# Patient Record
Sex: Female | Born: 1990 | Race: White | Hispanic: No | Marital: Single | Smoking: Current every day smoker
Health system: Southern US, Community
[De-identification: ages and names within clinical notes are randomized; demographics above are authoritative.]

## PROBLEM LIST (undated history)

## (undated) DIAGNOSIS — I82409 Acute embolism and thrombosis of unspecified deep veins of unspecified lower extremity: Secondary | ICD-10-CM

---
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
## 2008-04-30 ENCOUNTER — Emergency Department (HOSPITAL_COMMUNITY): Admission: EM | Admit: 2008-04-30 | Discharge: 2008-04-30 | Payer: Self-pay | Admitting: Emergency Medicine

## 2008-11-03 ENCOUNTER — Emergency Department (HOSPITAL_COMMUNITY): Admission: EM | Admit: 2008-11-03 | Discharge: 2008-11-03 | Payer: Self-pay | Admitting: Emergency Medicine

## 2009-07-17 ENCOUNTER — Emergency Department (HOSPITAL_COMMUNITY): Admission: EM | Admit: 2009-07-17 | Discharge: 2009-07-17 | Payer: Self-pay | Admitting: Emergency Medicine

## 2009-09-13 ENCOUNTER — Emergency Department (HOSPITAL_COMMUNITY): Admission: EM | Admit: 2009-09-13 | Discharge: 2009-09-13 | Payer: Self-pay | Admitting: Emergency Medicine

## 2009-09-26 ENCOUNTER — Inpatient Hospital Stay (HOSPITAL_COMMUNITY): Admission: EM | Admit: 2009-09-26 | Discharge: 2009-09-27 | Payer: Self-pay | Admitting: Emergency Medicine

## 2009-09-27 ENCOUNTER — Inpatient Hospital Stay (HOSPITAL_COMMUNITY): Admission: AD | Admit: 2009-09-27 | Discharge: 2009-10-01 | Payer: Self-pay | Admitting: Psychiatry

## 2009-09-27 ENCOUNTER — Ambulatory Visit: Payer: Self-pay | Admitting: Psychiatry

## 2010-03-20 LAB — BASIC METABOLIC PANEL
BUN: 6 mg/dL (ref 6–23)
BUN: 8 mg/dL (ref 6–23)
BUN: 9 mg/dL (ref 6–23)
CO2: 19 mEq/L (ref 19–32)
Calcium: 8.9 mg/dL (ref 8.4–10.5)
Calcium: 9 mg/dL (ref 8.4–10.5)
Chloride: 109 mEq/L (ref 96–112)
Chloride: 116 mEq/L — ABNORMAL HIGH (ref 96–112)
Chloride: 108 mEq/L (ref 96–112)
Creatinine, Ser: 0.94 mg/dL (ref 0.4–1.2)
Creatinine, Ser: 0.73 mg/dL (ref 0.4–1.2)
GFR calc Af Amer: >60 mL/min (ref >60)
GFR calc Af Amer: >60 mL/min (ref >60)
GFR calc Af Amer: >60 mL/min (ref >60)
GFR calc non Af Amer: >60 mL/min (ref >60)
GFR calc non Af Amer: >60 mL/min (ref >60)
Glucose, Bld: 92 mg/dL (ref 70–99)
Potassium: 2.9 mEq/L — ABNORMAL LOW (ref 3.5–5.1)
Potassium: 3.7 mEq/L (ref 3.5–5.1)
Sodium: 139 mEq/L (ref 135–145)
Sodium: 142 mEq/L (ref 135–145)

## 2010-03-20 LAB — URINALYSIS, ROUTINE W REFLEX MICROSCOPIC
Bilirubin Urine: NEGATIVE
Glucose, UA: NEGATIVE mg/dL
Ketones, ur: NEGATIVE mg/dL
Leukocytes, UA: NEGATIVE
Nitrite: NEGATIVE
Protein, ur: NEGATIVE mg/dL
Specific Gravity, Urine: 1.025 (ref 1.005–1.030)
Urobilinogen, UA: 0.2 mg/dL (ref 0.0–1.0)
pH: 6 (ref 5.0–8.0)

## 2010-03-20 LAB — DIFFERENTIAL
Basophils Absolute: 0.1 10*3/uL (ref 0.0–0.1)
Basophils Absolute: 0 10*3/uL (ref 0.0–0.1)
Basophils Relative: 1 % (ref 0–1)
Eosinophils Absolute: 0.1 10*3/uL (ref 0.0–0.7)
Eosinophils Absolute: 0.2 10*3/uL (ref 0.0–0.7)
Eosinophils Relative: 1 % (ref 0–5)
Eosinophils Relative: 3 % (ref 0–5)
Lymphocytes Relative: 20 % (ref 12–46)
Lymphocytes Relative: 33 % (ref 12–46)
Lymphs Abs: 1.9 10*3/uL (ref 0.7–4.0)
Monocytes Absolute: 0.5 10*3/uL (ref 0.1–1.0)
Monocytes Relative: 5 % (ref 3–12)
Neutro Abs: 7.1 10*3/uL (ref 1.7–7.7)
Neutrophils Relative %: 73 % (ref 43–77)
Neutrophils Relative %: 59 % (ref 43–77)

## 2010-03-20 LAB — ETHANOL
Alcohol, Ethyl (B): 48 mg/dL — ABNORMAL HIGH (ref 0–10)
Alcohol, Ethyl (B): 157 mg/dL — ABNORMAL HIGH (ref 0–10)

## 2010-03-20 LAB — CBC
HCT: 42 % (ref 36.0–46.0)
Hemoglobin: 14.4 g/dL (ref 12.0–15.0)
MCH: 32.7 pg (ref 26.0–34.0)
MCHC: 34.4 g/dL (ref 30.0–36.0)
MCV: 95.1 fL (ref 78.0–100.0)
MCV: 95.3 fL (ref 78.0–100.0)
Platelets: 234 10*3/uL (ref 150–400)
RBC: 4.41 MIL/uL (ref 3.87–5.11)
RBC: 4.45 MIL/uL (ref 3.87–5.11)
RDW: 13.6 % (ref 11.5–15.5)
RDW: 13.5 % (ref 11.5–15.5)
WBC: 9.7 10*3/uL (ref 4.0–10.5)
WBC: 8.6 10*3/uL (ref 4.0–10.5)

## 2010-03-20 LAB — CARDIAC PANEL(CRET KIN+CKTOT+MB+TROPI)
CK, MB: 0.5 ng/mL (ref 0.3–4.0)
CK, MB: 0.8 ng/mL (ref 0.3–4.0)
Relative Index: 0.3 (ref 0.0–2.5)
Relative Index: 0.6 (ref 0.0–2.5)
Total CK: 127 U/L (ref 7–177)
Total CK: 159 U/L (ref 7–177)
Total CK: 175 U/L (ref 7–177)
Troponin I: 0.01 ng/mL (ref 0.00–0.06)
Troponin I: <0.01 ng/mL (ref 0.00–0.06)

## 2010-03-20 LAB — HEPATIC FUNCTION PANEL
ALT: 16 U/L (ref 0–35)
AST: 25 U/L (ref 0–37)
Albumin: 4.5 g/dL (ref 3.5–5.2)
Alkaline Phosphatase: 82 U/L (ref 39–117)
Bilirubin, Direct: 0.1 mg/dL (ref 0.0–0.3)
Indirect Bilirubin: 0.3 mg/dL (ref 0.3–0.9)
Total Bilirubin: 0.4 mg/dL (ref 0.3–1.2)
Total Protein: 7.6 g/dL (ref 6.0–8.3)

## 2010-03-20 LAB — MAGNESIUM: Magnesium: 1.8 mg/dL (ref 1.5–2.5)

## 2010-03-20 LAB — RAPID URINE DRUG SCREEN, HOSP PERFORMED
Amphetamines: NOT DETECTED
Amphetamines: NOT DETECTED
Barbiturates: NOT DETECTED
Opiates: NOT DETECTED
Tetrahydrocannabinol: POSITIVE — AB

## 2010-03-20 LAB — POCT PREGNANCY, URINE: Preg Test, Ur: NEGATIVE

## 2010-03-20 LAB — URINE MICROSCOPIC-ADD ON

## 2010-03-20 LAB — POTASSIUM: Potassium: 4 mEq/L (ref 3.5–5.1)

## 2010-03-20 LAB — SALICYLATE LEVEL: Salicylate Lvl: <4 mg/dL (ref 2.8–20.0)

## 2010-03-20 LAB — MRSA PCR SCREENING: MRSA by PCR: NEGATIVE

## 2010-03-20 LAB — ACETAMINOPHEN LEVEL: Acetaminophen (Tylenol), Serum: <10 ug/mL — ABNORMAL LOW (ref 10–30)

## 2010-03-23 LAB — DIFFERENTIAL
Lymphocytes Relative: 25 % (ref 12–46)
Lymphs Abs: 2.6 10*3/uL (ref 0.7–4.0)
Monocytes Relative: 7 % (ref 3–12)
Neutro Abs: 6.9 10*3/uL (ref 1.7–7.7)
Neutrophils Relative %: 66 % (ref 43–77)

## 2010-03-23 LAB — BASIC METABOLIC PANEL
BUN: 9 mg/dL (ref 6–23)
CO2: 22 mEq/L (ref 19–32)
Chloride: 106 mEq/L (ref 96–112)
Creatinine, Ser: 0.63 mg/dL (ref 0.4–1.2)
GFR calc Af Amer: >60 mL/min (ref >60)
Glucose, Bld: 101 mg/dL — ABNORMAL HIGH (ref 70–99)

## 2010-03-23 LAB — URINALYSIS, ROUTINE W REFLEX MICROSCOPIC
Ketones, ur: NEGATIVE mg/dL
Leukocytes, UA: NEGATIVE
Nitrite: NEGATIVE
Protein, ur: NEGATIVE mg/dL
Urobilinogen, UA: 0.2 mg/dL (ref 0.0–1.0)

## 2010-03-23 LAB — WET PREP, GENITAL: Yeast Wet Prep HPF POC: NONE SEEN

## 2010-03-23 LAB — TYPE AND SCREEN: Antibody Screen: NEGATIVE

## 2010-03-23 LAB — CBC
MCH: 32.3 pg (ref 26.0–34.0)
MCV: 95.8 fL (ref 78.0–100.0)
Platelets: 273 10*3/uL (ref 150–400)
RBC: 4.31 MIL/uL (ref 3.87–5.11)

## 2010-03-23 LAB — POCT PREGNANCY, URINE: Preg Test, Ur: POSITIVE

## 2010-03-23 LAB — GC/CHLAMYDIA PROBE AMP, GENITAL
Chlamydia, DNA Probe: NEGATIVE
GC Probe Amp, Genital: NEGATIVE

## 2010-03-23 LAB — HCG, QUANTITATIVE, PREGNANCY: hCG, Beta Chain, Quant, S: 74596 m[IU]/mL — ABNORMAL HIGH (ref <5)
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
## 2010-10-03 LAB — BASIC METABOLIC PANEL
Calcium: 9.1
Chloride: 117 — ABNORMAL HIGH
GFR calc Af Amer: >60
GFR calc non Af Amer: >60
Glucose, Bld: 112 — ABNORMAL HIGH
Potassium: 3.5
Sodium: 141
Sodium: 141

## 2010-10-03 LAB — URINALYSIS, ROUTINE W REFLEX MICROSCOPIC
Glucose, UA: NEGATIVE
Hgb urine dipstick: NEGATIVE
Specific Gravity, Urine: >1.03 — ABNORMAL HIGH
Urobilinogen, UA: 0.2
pH: 6

## 2010-10-03 LAB — DIFFERENTIAL
Basophils Absolute: 0
Lymphocytes Relative: 25
Monocytes Absolute: 0.6
Neutro Abs: 4.4

## 2010-10-03 LAB — CBC
HCT: 34.4 — ABNORMAL LOW
Hemoglobin: 12.7
Hemoglobin: 11.8 — ABNORMAL LOW
MCV: 94.6
Platelets: 214
RBC: 3.64 — ABNORMAL LOW
RDW: 12.9
WBC: 6.6

## 2010-10-03 LAB — URINE MICROSCOPIC-ADD ON

## 2010-10-22 ENCOUNTER — Emergency Department (HOSPITAL_COMMUNITY)
Admission: EM | Admit: 2010-10-22 | Discharge: 2010-10-22 | Discharge status: Against Medical Advice | Payer: Medicaid Other | Attending: Emergency Medicine | Admitting: Emergency Medicine

## 2010-10-22 DIAGNOSIS — Z86718 Personal history of other venous thrombosis and embolism: Secondary | ICD-10-CM | POA: Insufficient documentation

## 2010-10-22 DIAGNOSIS — Z5320 Procedure and treatment not carried out because of patient's decision for unspecified reasons: Secondary | ICD-10-CM | POA: Insufficient documentation

## 2010-10-22 DIAGNOSIS — O99891 Other specified diseases and conditions complicating pregnancy: Secondary | ICD-10-CM | POA: Insufficient documentation

## 2010-10-22 DIAGNOSIS — M79609 Pain in unspecified limb: Secondary | ICD-10-CM | POA: Insufficient documentation

## 2010-10-22 HISTORY — DX: Acute embolism and thrombosis of unspecified deep veins of unspecified lower extremity: I82.409

## 2010-10-22 NOTE — ED Notes (Signed)
Pt not in waiting room

## 2010-10-22 NOTE — ED Notes (Signed)
Called for pt, pt not in waiting room.

## 2010-10-22 NOTE — ED Notes (Signed)
Pt reports Friday found out she is pregnant.  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  Reports woke up this morning with pain left thigh.  Denies injury.  Pedal pulse present, extremity warm and color wnl.

## 2012-04-06 ENCOUNTER — Ambulatory Visit: Payer: Self-pay | Admitting: Pediatrics

## 2013-11-02 ENCOUNTER — Encounter (HOSPITAL_COMMUNITY): Payer: Self-pay | Admitting: Emergency Medicine

## 2013-11-02 ENCOUNTER — Emergency Department (HOSPITAL_COMMUNITY)
Admission: EM | Admit: 2013-11-02 | Discharge: 2013-11-02 | Disposition: A | Discharge status: Home / Self Care | Payer: Medicaid Other | Attending: Emergency Medicine | Admitting: Emergency Medicine

## 2013-11-02 DIAGNOSIS — R0981 Nasal congestion: Secondary | ICD-10-CM | POA: Insufficient documentation

## 2013-11-02 DIAGNOSIS — H109 Unspecified conjunctivitis: Secondary | ICD-10-CM

## 2013-11-02 DIAGNOSIS — H6123 Impacted cerumen, bilateral: Secondary | ICD-10-CM | POA: Insufficient documentation

## 2013-11-02 DIAGNOSIS — H1033 Unspecified acute conjunctivitis, bilateral: Secondary | ICD-10-CM | POA: Insufficient documentation

## 2013-11-02 DIAGNOSIS — Z86718 Personal history of other venous thrombosis and embolism: Secondary | ICD-10-CM | POA: Diagnosis not present

## 2013-11-02 DIAGNOSIS — Z720 Tobacco use: Secondary | ICD-10-CM | POA: Insufficient documentation

## 2013-11-02 DIAGNOSIS — R0982 Postnasal drip: Secondary | ICD-10-CM | POA: Insufficient documentation

## 2013-11-02 MED ORDER — FLUORESCEIN SODIUM 1 MG OP STRP
1.0000 | ORAL_STRIP | Freq: Once | OPHTHALMIC | Status: AC
  Administered 2013-11-02: 1 via OPHTHALMIC
  Filled 2013-11-02: qty 1

## 2013-11-02 MED ORDER — POLYMYXIN B-TRIMETHOPRIM 10000-0.1 UNIT/ML-% OP SOLN
2.0000 [drp] | OPHTHALMIC | Status: DC
  Administered 2013-11-02: 2 [drp] via OPHTHALMIC
  Filled 2013-11-02: qty 10

## 2013-11-02 MED ORDER — DOCUSATE SODIUM 50 MG/5ML PO LIQD
50.0000 mg | Freq: Once | ORAL | Status: AC
  Administered 2013-11-02: 50 mg via ORAL
  Filled 2013-11-02: qty 10

## 2013-11-02 MED ORDER — TETRACAINE HCL 0.5 % OP SOLN
1.0000 [drp] | Freq: Once | OPHTHALMIC | Status: AC
  Administered 2013-11-02: 2 [drp] via OPHTHALMIC
  Filled 2013-11-02: qty 2

## 2013-11-02 MED ORDER — HYDROGEN PEROXIDE 3 % EX SOLN
CUTANEOUS | Status: AC
  Filled 2013-11-02: qty 473

## 2013-11-02 NOTE — ED Provider Notes (Signed)
CSN: [id number]     Arrival date & time 11/02/13  [id number] History   First MD Initiated Contact with Patient 11/02/13 [phone]     Chief Complaint  Patient presents with  . Conjunctivitis     (Consider location/radiation/quality/duration/timing/severity/associated sxs/prior Treatment) HPI Ms. Kari Khan is a 23 year old female with past medical history of DVT who presents to ER with one night of bilateral conjunctivitis. Patient states she has a 852-1/14-year-old daughter and daycare, who recently came home with conjunctivitis which was diagnosed by her pediatrician and treated for same. Patient reports that last night she began to noted mild irritation of her eyes, and through the night noticed an increase in pruritus. Patient states this morning she noted thick, yellow discharge from her eyes bilaterally and increase in irritation. Patient also complaining of trouble hearing out of her left ear which has occurred gradually over an unknown amount of time to her. Patient denies any fever, nausea, vomiting, sore throat, dysphagia, dizziness, blurred vision, loss of vision, change in visual acuity, visual disturbances, eye pain, tinnitus.  Past Medical History  Diagnosis Date  . DVT (deep venous thrombosis)    History reviewed. No pertinent past surgical history. No family history on file. History  Substance Use Topics  . Smoking status: Current Every Day Smoker -- 1.00 packs/day    Types: Cigarettes  . Smokeless tobacco: Not on file  . Alcohol Use: No   OB History   Grav Para Term Preterm Abortions TAB SAB Ect Mult Living                 Review of Systems  Constitutional: Negative for fever.  HENT: Positive for congestion and postnasal drip. Negative for rhinorrhea, sore throat, trouble swallowing and voice change.   Eyes: Positive for photophobia, discharge, redness and itching. Negative for visual disturbance.  Respiratory: Negative for shortness of breath.   Cardiovascular: Negative for chest  pain.  Gastrointestinal: Negative for nausea, vomiting and abdominal pain.  Genitourinary: Negative for dysuria.  Skin: Negative for rash.  Neurological: Negative for dizziness, syncope, weakness and numbness.  Psychiatric/Behavioral: Negative.       Allergies  Review of patient's allergies indicates no known allergies.  Home Medications   Prior to Admission medications   Not on File   BP 122/71  Pulse 83  Temp(Src) 97.9 F (36.6 C)  Resp 16  Ht 5\' 1"  (1.549 m)  Wt 95 lb (43.092 kg)  BMI 17.96 kg/m2  SpO2 100%  LMP 10/08/2013 Physical Exam  Nursing note and vitals reviewed. Constitutional: She is oriented to person, place, and time. She appears well-developed and well-nourished. No distress.  HENT:  Head: Normocephalic and atraumatic.  Right Ear: No tenderness. Decreased hearing is noted.  Left Ear: No tenderness. Decreased hearing is noted.  Mouth/Throat: Uvula is midline and mucous membranes are normal. Posterior oropharyngeal edema present. No oropharyngeal exudate, posterior oropharyngeal erythema or tonsillar abscesses.  Ears: Moderate to severe bilateral cerumen impaction.  Eyes: PERRLA. EOMs intact. Patient has mild bilateral bulbar and conjunctival erythema. No edema, erythema, ecchymosis, of lids. No chemosis or proptosis noted.  Eyes: EOM are normal. Pupils are equal, round, and reactive to light. Right eye exhibits no discharge. Left eye exhibits no discharge. Right conjunctiva is injected. Left conjunctiva is injected. No scleral icterus. Right eye exhibits no nystagmus. Left eye exhibits no nystagmus.  Slit lamp exam:      The right eye shows fluorescein uptake. The right eye shows no corneal abrasion, no corneal ulcer,  no hyphema, no hypopyon and no anterior chamber bulge.       The left eye shows no corneal abrasion, no corneal ulcer, no hyphema, no hypopyon, no fluorescein uptake and no anterior chamber bulge.  Mild area of flouroscein uptake on right eye of  unclear etiology. No dendritic ulcers, corneal abrasions noted.  Neck: Normal range of motion.  Pulmonary/Chest: Effort normal. No respiratory distress.  Musculoskeletal: Normal range of motion.  Neurological: She is alert and oriented to person, place, and time.  Skin: Skin is warm and dry. She is not diaphoretic.  Psychiatric: She has a normal mood and affect.    ED Course  Procedures (including critical care time) Labs Review Labs Reviewed - No data to display  Imaging Review No results found.   EKG Interpretation None      MDM   Final diagnoses:  Bilateral conjunctivitis  Cerumen impaction, bilateral    1. Conjunctivitis  Patient seen and evaluated for conjunctivitis. Normal vision is noted. No foreign bodies, corneal abrasions, or obvious trauma noted on exam. No obvious erythema, vision change, vision loss, concern for orbital or preseptal cellulitis. No iritis or chemosis. No concern for glaucoma, no signs or symptoms of retinal detachment or floaters. Presentation not concerning for ophthalmological emergency. Patient discharged with polymyxin B drops for a possible bacterial conjunctivitis. I provided patient's with on-call ophthalmology referral to follow up if her symptoms are not improving within a few days. I discussed return precautions with patient, and encouraged her to call or return to the ER should her symptoms worsen or should she have any questions or concerns.  2. Ear cerumen impaction Docusate enema placed in ears bilaterally and cleaned out.  BP 122/71  Pulse 83  Temp(Src) 97.9 F (36.6 C)  Resp 16  Ht 5\' 1"  (1.549 m)  Wt 95 lb (43.092 kg)  BMI 17.96 kg/m2  SpO2 100%  LMP 10/08/2013  Signed,  Ladona MowJoe Crit Obremski, PA-C 5:03 PM  Patient discussed with Dr. Donnetta HutchingBrian Cook, M.D.   Monte FantasiaJoseph W Mega Kinkade, PA-C 11/02/13 [phone]

## 2013-11-02 NOTE — ED Notes (Signed)
Pt c/o conjunctivitis to both eyes.

## 2013-11-02 NOTE — Discharge Instructions (Signed)
Continue using polymyxin B 2 drops to each eye every 4 hours for the next 7 days. Follow-up with Dr. Lita MainsHaines if symptoms do not improve.  Conjunctivitis Conjunctivitis is commonly called "pink eye." Conjunctivitis can be caused by bacterial or viral infection, allergies, or injuries. There is usually redness of the lining of the eye, itching, discomfort, and sometimes discharge. There may be deposits of matter along the eyelids. A viral infection usually causes a watery discharge, while a bacterial infection causes a yellowish, thick discharge. Pink eye is very contagious and spreads by direct contact. You may be given antibiotic eyedrops as part of your treatment. Before using your eye medicine, remove all drainage from the eye by washing gently with warm water and cotton balls. Continue to use the medication until you have awakened 2 mornings in a row without discharge from the eye. Do not rub your eye. This increases the irritation and helps spread infection. Use separate towels from other household members. Wash your hands with soap and water before and after touching your eyes. Use cold compresses to reduce pain and sunglasses to relieve irritation from light. Do not wear contact lenses or wear eye makeup until the infection is gone. SEEK MEDICAL CARE IF:   Your symptoms are not better after 3 days of treatment.  You have increased pain or trouble seeing.  The outer eyelids become very red or swollen. Document Released: 01/30/2004 Document Revised: 03/16/2011 Document Reviewed: 12/22/2004 [facility] Patient Information 2015 [place], [place]. This information is not intended to replace advice given to you by your health care provider. Make sure you discuss any questions you have with your health care provider.   Cerumen Impaction A cerumen impaction is when the wax in your ear forms a plug. This plug usually causes reduced hearing. Sometimes it also causes an earache or dizziness. Removing a cerumen  impaction can be difficult and painful. The wax sticks to the ear canal. The canal is sensitive and bleeds easily. If you try to remove a heavy wax buildup with a cotton tipped swab, you may push it in further. Irrigation with water, suction, and small ear curettes may be used to clear out the wax. If the impaction is fixed to the skin in the ear canal, ear drops may be needed for a few days to loosen the wax. People who build up a lot of wax frequently can use ear wax removal products available in your local drugstore. SEEK MEDICAL CARE IF:  You develop an earache, increased hearing loss, or marked dizziness. Document Released: 01/30/2004 Document Revised: 03/16/2011 Document Reviewed: 03/21/2009 [facility] Patient Information 2015 [place]Care, [place]. This information is not intended to replace advice given to you by your health care provider. Make sure you discuss any questions you have with your health care provider.

## 2013-11-02 NOTE — ED Notes (Signed)
Discharge instructions reviewed with pt, questions answered. Pt verbalized understanding.  

## 2013-11-02 NOTE — ED Notes (Signed)
Both ears irrigated with success.

## 2013-11-03 NOTE — ED Provider Notes (Signed)
Medical screening examination/treatment/procedure(s) were conducted as a shared visit with non-physician practitioner(s) and myself.  I personally evaluated the patient during the encounter.   EKG Interpretation None     Hx and PE c/w conjunctivitis.  Rx atb drops  Donnetta HutchingBrian Avian Konigsberg, MD 11/03/13 [phone]

## 2014-01-25 ENCOUNTER — Encounter (HOSPITAL_COMMUNITY): Payer: Self-pay

## 2014-01-25 ENCOUNTER — Emergency Department (HOSPITAL_COMMUNITY)
Admission: EM | Admit: 2014-01-25 | Discharge: 2014-01-26 | Disposition: A | Discharge status: Home / Self Care | Payer: Medicaid Other | Attending: Emergency Medicine | Admitting: Emergency Medicine

## 2014-01-25 ENCOUNTER — Emergency Department (HOSPITAL_COMMUNITY): Payer: Medicaid Other

## 2014-01-25 DIAGNOSIS — Z3202 Encounter for pregnancy test, result negative: Secondary | ICD-10-CM | POA: Insufficient documentation

## 2014-01-25 DIAGNOSIS — Z720 Tobacco use: Secondary | ICD-10-CM | POA: Diagnosis not present

## 2014-01-25 DIAGNOSIS — R1031 Right lower quadrant pain: Secondary | ICD-10-CM

## 2014-01-25 DIAGNOSIS — N739 Female pelvic inflammatory disease, unspecified: Secondary | ICD-10-CM | POA: Insufficient documentation

## 2014-01-25 DIAGNOSIS — N730 Acute parametritis and pelvic cellulitis: Secondary | ICD-10-CM

## 2014-01-25 DIAGNOSIS — Z86718 Personal history of other venous thrombosis and embolism: Secondary | ICD-10-CM | POA: Insufficient documentation

## 2014-01-25 LAB — CBC WITH DIFFERENTIAL/PLATELET
BASOS PCT: 0 % (ref 0–1)
Basophils Absolute: 0 10*3/uL (ref 0.0–0.1)
EOS PCT: 1 % (ref 0–5)
Eosinophils Absolute: 0.1 10*3/uL (ref 0.0–0.7)
HEMATOCRIT: 41 % (ref 36.0–46.0)
Hemoglobin: 13.9 g/dL (ref 12.0–15.0)
Lymphocytes Relative: 19 % (ref 12–46)
Lymphs Abs: 2.1 10*3/uL (ref 0.7–4.0)
MCH: 31.2 pg (ref 26.0–34.0)
MCHC: 33.9 g/dL (ref 30.0–36.0)
MCV: 92.1 fL (ref 78.0–100.0)
MONO ABS: 0.7 10*3/uL (ref 0.1–1.0)
Monocytes Relative: 6 % (ref 3–12)
NEUTROS ABS: 8.3 10*3/uL — AB (ref 1.7–7.7)
Neutrophils Relative %: 74 % (ref 43–77)
Platelets: 298 10*3/uL (ref 150–400)
RBC: 4.45 MIL/uL (ref 3.87–5.11)
RDW: 12.4 % (ref 11.5–15.5)
WBC: 11.2 10*3/uL — ABNORMAL HIGH (ref 4.0–10.5)

## 2014-01-25 LAB — BASIC METABOLIC PANEL
Anion gap: 7 (ref 5–15)
BUN: 10 mg/dL (ref 6–23)
CHLORIDE: 104 meq/L (ref 96–112)
CO2: 27 mmol/L (ref 19–32)
CREATININE: 0.78 mg/dL (ref 0.50–1.10)
Calcium: 9.1 mg/dL (ref 8.4–10.5)
GFR calc non Af Amer: >90 mL/min (ref >90)
Glucose, Bld: 101 mg/dL — ABNORMAL HIGH (ref 70–99)
POTASSIUM: 3.7 mmol/L (ref 3.5–5.1)
Sodium: 138 mmol/L (ref 135–145)

## 2014-01-25 LAB — WET PREP, GENITAL
Trich, Wet Prep: NONE SEEN
Yeast Wet Prep HPF POC: NONE SEEN

## 2014-01-25 LAB — URINALYSIS, ROUTINE W REFLEX MICROSCOPIC
BILIRUBIN URINE: NEGATIVE
Glucose, UA: NEGATIVE mg/dL
HGB URINE DIPSTICK: NEGATIVE
Leukocytes, UA: NEGATIVE
NITRITE: NEGATIVE
Protein, ur: NEGATIVE mg/dL
Specific Gravity, Urine: >1.03 — ABNORMAL HIGH (ref 1.005–1.030)
UROBILINOGEN UA: 0.2 mg/dL (ref 0.0–1.0)
pH: 5.5 (ref 5.0–8.0)

## 2014-01-25 LAB — PREGNANCY, URINE: PREG TEST UR: NEGATIVE

## 2014-01-25 MED ORDER — DEXTROSE 5 % IV SOLN
250.0000 mg | Freq: Once | INTRAVENOUS | Status: AC
  Administered 2014-01-26: 250 mg via INTRAVENOUS
  Filled 2014-01-25: qty 250

## 2014-01-25 MED ORDER — MORPHINE SULFATE 4 MG/ML IJ SOLN
4.0000 mg | Freq: Once | INTRAMUSCULAR | Status: AC
  Administered 2014-01-25: 4 mg via INTRAVENOUS
  Filled 2014-01-25: qty 1

## 2014-01-25 MED ORDER — CEFTRIAXONE SODIUM 500 MG IJ SOLR
INTRAMUSCULAR | Status: AC
  Filled 2014-01-25: qty 500

## 2014-01-25 MED ORDER — DOXYCYCLINE HYCLATE 100 MG PO CAPS: 100.0000 mg | ORAL_CAPSULE | Freq: Two times a day (BID) | ORAL | Status: AC

## 2014-01-25 MED ORDER — KETOROLAC TROMETHAMINE 30 MG/ML IJ SOLN
15.0000 mg | Freq: Once | INTRAMUSCULAR | Status: AC
  Administered 2014-01-25: 15 mg via INTRAVENOUS
  Filled 2014-01-25: qty 1

## 2014-01-25 MED ORDER — ONDANSETRON HCL 4 MG/2ML IJ SOLN
4.0000 mg | Freq: Once | INTRAMUSCULAR | Status: AC
  Administered 2014-01-25: 4 mg via INTRAMUSCULAR
  Filled 2014-01-25: qty 2

## 2014-01-25 MED ORDER — METRONIDAZOLE 500 MG PO TABS
2000.0000 mg | ORAL_TABLET | Freq: Once | ORAL | Status: AC
  Administered 2014-01-25: 2000 mg via ORAL
  Filled 2014-01-25: qty 4

## 2014-01-25 MED ORDER — AZITHROMYCIN 250 MG PO TABS
1000.0000 mg | ORAL_TABLET | Freq: Once | ORAL | Status: AC
  Administered 2014-01-25: 1000 mg via ORAL
  Filled 2014-01-25: qty 4

## 2014-01-25 MED ORDER — HYDROMORPHONE HCL 1 MG/ML IJ SOLN
0.5000 mg | Freq: Once | INTRAMUSCULAR | Status: AC
  Administered 2014-01-25: 0.5 mg via INTRAVENOUS
  Filled 2014-01-25: qty 1

## 2014-01-25 MED ORDER — IOHEXOL 300 MG/ML  SOLN
50.0000 mL | Freq: Once | INTRAMUSCULAR | Status: AC | PRN
  Administered 2014-01-25: 50 mL via ORAL

## 2014-01-25 MED ORDER — SODIUM CHLORIDE 0.9 % IV BOLUS (SEPSIS)
500.0000 mL | Freq: Once | INTRAVENOUS | Status: AC
  Administered 2014-01-25: 500 mL via INTRAVENOUS

## 2014-01-25 MED ORDER — IOHEXOL 300 MG/ML  SOLN
100.0000 mL | Freq: Once | INTRAMUSCULAR | Status: AC | PRN
  Administered 2014-01-25: 100 mL via INTRAVENOUS

## 2014-01-25 MED ORDER — OXYCODONE-ACETAMINOPHEN 5-325 MG PO TABS: 1.0000 | ORAL_TABLET | ORAL | Status: AC | PRN

## 2014-01-25 NOTE — Discharge Instructions (Signed)
Pelvic Inflammatory Disease °Pelvic inflammatory disease (PID) refers to an infection in some or all of the female organs. The infection can be in the uterus, ovaries, fallopian tubes, or the surrounding tissues in the pelvis. PID can cause abdominal or pelvic pain that comes on suddenly (acute pelvic pain). PID is a serious infection because it can lead to lasting (chronic) pelvic pain or the inability to have children (infertile).  °CAUSES  °The infection is often caused by the normal bacteria found in the vaginal tissues. PID may also be caused by an infection that is spread during sexual contact. PID can also occur following:  °· The birth of a baby.   °· A miscarriage.   °· An abortion.   °· Major pelvic surgery.   °· The use of an intrauterine device (IUD).   °· A sexual assault.   °RISK FACTORS °Certain factors can put a person at higher risk for PID, such as: °· Being younger than 25 years. °· Being sexually active at a young age. °· Using nonbarrier contraception. °· Having multiple sexual partners. °· Having sex with someone who has symptoms of a genital infection. °· Using oral contraception. °Other times, certain behaviors can increase the possibility of getting PID, such as: °· Having sex during your period. °· Using a vaginal douche. °· Having an intrauterine device (IUD) in place. °SYMPTOMS  °· Abdominal or pelvic pain.   °· Fever.   °· Chills.   °· Abnormal vaginal discharge. °· Abnormal uterine bleeding.   °· Unusual pain shortly after finishing your period. °DIAGNOSIS  °Your caregiver will choose some of the following methods to make a diagnosis, such as:  °· Performing a physical exam and history. A pelvic exam typically reveals a very tender uterus and surrounding pelvis.   °· Ordering laboratory tests including a pregnancy test, blood tests, and urine test.  °· Ordering cultures of the vagina and cervix to check for a sexually transmitted infection (STI). °· Performing an ultrasound.    °· Performing a laparoscopic procedure to look inside the pelvis.   °TREATMENT  °· Antibiotic medicines may be prescribed and taken by mouth.   °· Sexual partners may be treated when the infection is caused by a sexually transmitted disease (STD).   °· Hospitalization may be needed to give antibiotics intravenously. °· Surgery may be needed, but this is rare. °It may take weeks until you are completely well. If you are diagnosed with PID, you should also be checked for human immunodeficiency virus (HIV).   °HOME CARE INSTRUCTIONS  °· If given, take your antibiotics as directed. Finish the medicine even if you start to feel better.   °· Only take over-the-counter or prescription medicines for pain, discomfort, or fever as directed by your caregiver.   °· Do not have sexual intercourse until treatment is completed or as directed by your caregiver. If PID is confirmed, your recent sexual partner(s) will need treatment.   °· Keep your follow-up appointments. °SEEK MEDICAL CARE IF:  °· You have increased or abnormal vaginal discharge.   °· You need prescription medicine for your pain.   °· You vomit.   °· You cannot take your medicines.   °· Your partner has an STD.   °SEEK IMMEDIATE MEDICAL CARE IF:  °· You have a fever.   °· You have increased abdominal or pelvic pain.   °· You have chills.   °· You have pain when you urinate.   °· You are not better after 72 hours following treatment.   °MAKE SURE YOU:  °· Understand these instructions. °· Will watch your condition. °· Will get help right away if you are not doing well or get worse. °  Document Released: 12/22/2004 Document Revised: 04/18/2012 Document Reviewed: 12/18/2010 °ExitCare® Patient Information ©2015 [employer], LLC. This information is not intended to replace advice given to you by your health care provider. Make sure you discuss any questions you have with your health care provider. ° °

## 2014-01-25 NOTE — ED Notes (Signed)
Patient ambulatory to and from restroom with no deficits.

## 2014-01-25 NOTE — ED Provider Notes (Signed)
CSN: [id number]     Arrival date & time 01/25/14  1802 History  This chart was scribed for Raeford Razor, MD by Richarda Overlie, ED Scribe. This patient was seen in room [facility]12/[facility]12 and the patient's care was started 6:16 PM.    Chief Complaint  Patient presents with  . Abdominal Pain   The history is provided by the patient. No language interpreter was used.   HPI Comments: Kari Khan is a 24 y.o. female who presents to the Emergency Department complaining of worsening, constant right lower abdominal pain for the last 2 days. Pt describes the pain as stabbing. She says her pain worsens when she sits down opposed to standing. She reports associated cough and chills. Pt states she has tried tylenol which failed to relieve her symptoms. She states that she finished her last period 6 days ago. She reports a history of smoking. Pt denies any history of ovarian cysts. Pt reports no history of abdominal surgeries. She reports no pertinent past medical history at this time. She denies fever, urinary symptoms, back pain, vaginal bleeding or dishcarge.    Past Medical History  Diagnosis Date  . DVT (deep venous thrombosis)    History reviewed. No pertinent past surgical history. No family history on file. History  Substance Use Topics  . Smoking status: Current Every Day Smoker -- 1.00 packs/day    Types: Cigarettes  . Smokeless tobacco: Not on file  . Alcohol Use: No   OB History    No data available     Review of Systems  All other systems reviewed and are negative.     Allergies  Review of patient's allergies indicates no known allergies.  Home Medications   Prior to Admission medications   Not on File   BP 122/85 mmHg  Pulse 110  Temp(Src) 97.8 F (36.6 C) (Oral)  Resp 20  Ht  (1.549 m)  Wt 95 lb (43.092 kg)  BMI 17.96 kg/m2  SpO2 100%  LMP 01/10/2014 Physical Exam  Constitutional: She appears well-developed and well-nourished. No distress.  HENT:  Head:  Normocephalic and atraumatic.  Right Ear: External ear normal.  Left Ear: External ear normal.  Eyes: Conjunctivae are normal. Right eye exhibits no discharge. Left eye exhibits no discharge. No scleral icterus.  Neck: Neck supple. No tracheal deviation present.  Cardiovascular: Normal rate, regular rhythm and intact distal pulses.   Pulmonary/Chest: Effort normal and breath sounds normal. No stridor. No respiratory distress. She has no wheezes. She has no rales.  Abdominal: Soft. Bowel sounds are normal. She exhibits no distension. There is tenderness. There is guarding. There is no rebound.  RLQ tenderness with voluntarily guarding.   Genitourinary:  Chaperone present. Normal external female genitalia. Copious, thick whitish vaginal discharge. Cervicitis. Positive CMT & right adnexal tenderness.  Musculoskeletal: She exhibits no edema or tenderness.  Neurological: She is alert. She has normal strength. No cranial nerve deficit (no facial droop, extraocular movements intact, no slurred speech) or sensory deficit. She exhibits normal muscle tone. She displays no seizure activity. Coordination normal.  Skin: Skin is warm and dry. No rash noted.  Psychiatric: She has a normal mood and affect.  Nursing note and vitals reviewed.   ED Course  Procedures   DIAGNOSTIC STUDIES: Oxygen Saturation is 100% on RA, normal by my interpretation.    COORDINATION OF CARE: 6:18 PM Discussed treatment plan with pt at bedside and pt agreed to plan.   Labs Review Labs Reviewed  CBC WITH DIFFERENTIAL - Abnormal; Notable for the following:    WBC 11.2 (*)    Neutro Abs 8.3 (*)    All other components within normal limits  BASIC METABOLIC PANEL - Abnormal; Notable for the following:    Glucose, Bld 101 (*)    All other components within normal limits  URINALYSIS, ROUTINE W REFLEX MICROSCOPIC - Abnormal; Notable for the following:    Specific Gravity, Urine >1.030 (*)    Ketones, ur TRACE (*)    All  other components within normal limits  PREGNANCY, URINE    Imaging Review Ct Abdomen Pelvis W Contrast  01/25/2014   CLINICAL DATA:  Pt reports pain in rlq with coughing for the past couple of days. Today pain has been constant. Denies abnormal vaginal bleeding or discharge, denies problems urinating. LBM was this morning.  EXAM: CT ABDOMEN AND PELVIS WITH CONTRAST  TECHNIQUE: Multidetector CT imaging of the abdomen and pelvis was performed using the standard protocol following bolus administration of intravenous contrast.  CONTRAST:  50mL OMNIPAQUE IOHEXOL 300 MG/ML SOLN, 100mL OMNIPAQUE IOHEXOL 300 MG/ML SOLN  COMPARISON:  08/01/2007  FINDINGS: Patchy opacity noted in the anterior lung bases which may reflect atelectasis, infiltrate or a combination. Remainder of the lung bases is clear. Heart normal in size.  Normal liver, spleen, gallbladder, pancreas, adrenal glands, kidneys, ureters, bladder.  IUD well-positioned in a normal uterus.  No adnexal masses.  No adenopathy.  No abnormal fluid collections.  Normal bowel. Appendix not definitively seen, but no evidence of appendicitis.  No bony abnormality.  IMPRESSION: 1. Patchy opacity in the anterior lung bases. Given history of cough, this may reflect pneumonia. Opacity could be due to atelectasis or combination. 2. No abnormality below the diaphragm.   Electronically Signed   By: Amie Portlandavid  Ormond M.D.   On: 01/25/2014 20:20     EKG Interpretation None      MDM   Final diagnoses:  RLQ abdominal pain  PID (acute pelvic inflammatory disease)   24 year old female with right lower quadrant pain. CT without evidence of appendicitis or other acute pathology. Exam concerning for possible PID. She is afebrile. He did medically stable. I feel she is appropriate for outpatient treatment. Although not test of choice, CT without evidence of TOA.   I personally preformed the services scribed in my presence. The recorded information has been reviewed is  accurate. Raeford RazorStephen Frederico Gerling, MD.      Raeford RazorStephen Hjalmer Iovino, MD 01/26/14 2051

## 2014-01-25 NOTE — ED Notes (Signed)
Pt reports pain in rlq with coughing for the past couple of days.  Today pain has been constant.  Denies abnormal vaginal bleeding or discharge, denies problems urinating.  LBM was this morning.

## 2014-01-25 NOTE — ED Notes (Signed)
MD at bedside. 

## 2014-01-26 NOTE — ED Notes (Signed)
Patient verbalizes understanding of discharge instructions, prescription medications, home care and follow up care. Patient ambulatory out of department at this time. 

## 2014-01-29 LAB — GC/CHLAMYDIA PROBE AMP (~~LOC~~) NOT AT ARMC
Chlamydia: NEGATIVE
Neisseria Gonorrhea: NEGATIVE

## 2015-05-02 IMAGING — CT CT ABD-PELV W/ CM
2 of 3 series · 16 of 46 positions shown, 18 images · IV contrast (Omnipaque 300)
Comparison: 08/01/2007

CLINICAL DATA: Pt reports pain in rlq with coughing for the past
couple of days. Today pain has been constant. Denies abnormal
vaginal bleeding or discharge, denies problems urinating. LBM was
this morning.

EXAM:
CT ABDOMEN AND PELVIS WITH CONTRAST
TECHNIQUE: Multidetector CT imaging of the abdomen and pelvis was performed
using the standard protocol following bolus administration of
intravenous contrast.
CONTRAST:  50mL OMNIPAQUE IOHEXOL 300 MG/ML SOLN, 100mL OMNIPAQUE
IOHEXOL 300 MG/ML SOLN

[Series 2: abd_pel_with 5.0 b40f · axial · 0.56mm/px · z∈[-467,-127]mm · 13 of 80 slices shown, 15 images]
[im 6/80  soft-tissue]
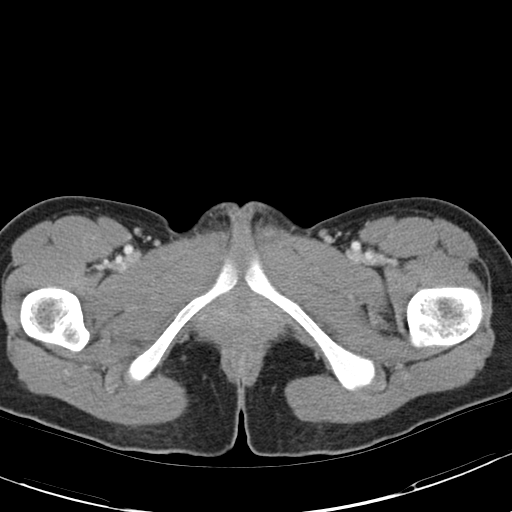
[im 6/80  bone]
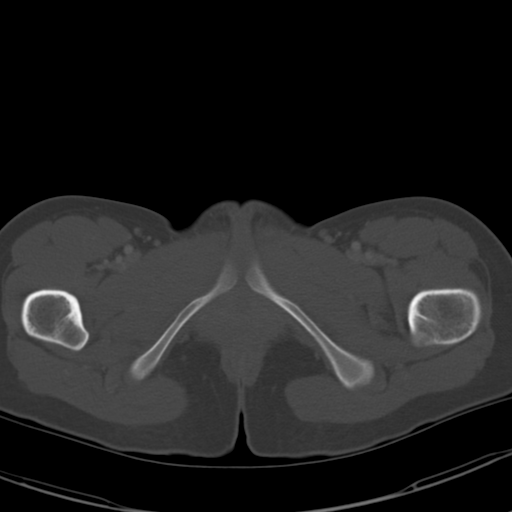
[im 11/80  soft-tissue]
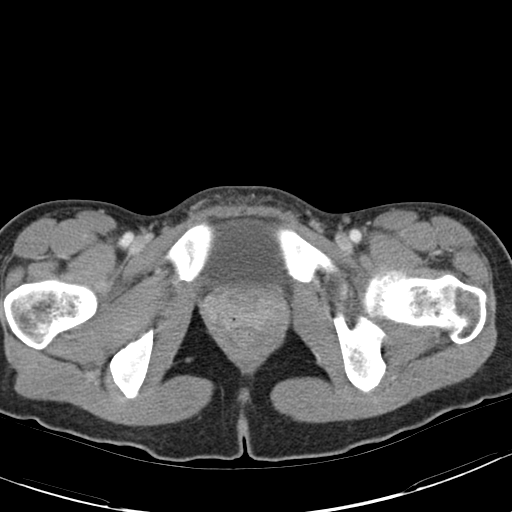
[im 16/80  soft-tissue]
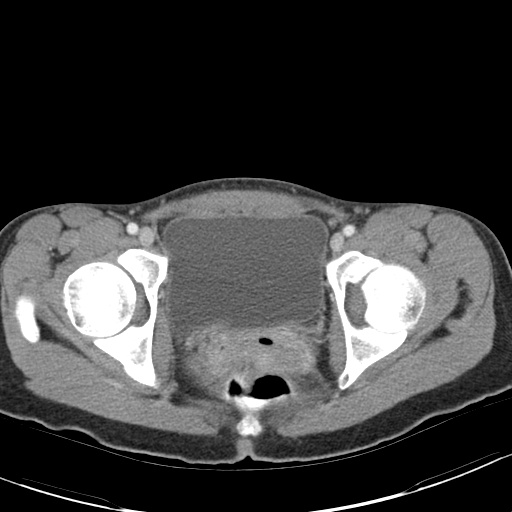
[im 23/80  soft-tissue]
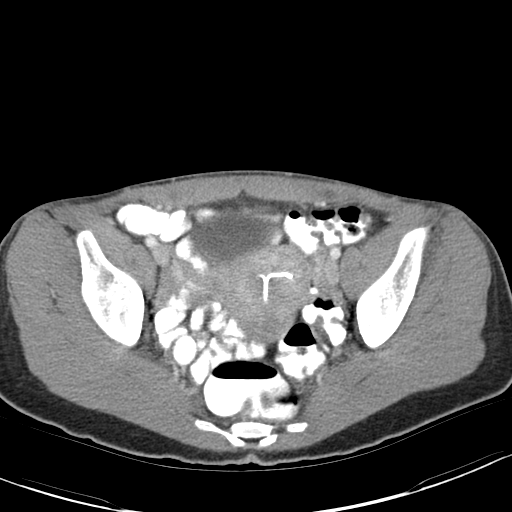
[im 29/80  soft-tissue]
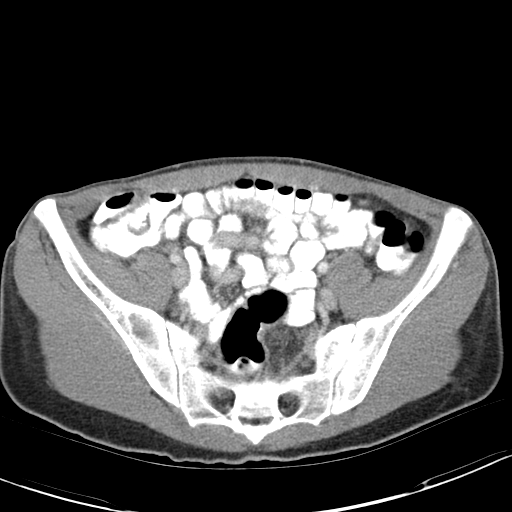
[im 34/80  soft-tissue]
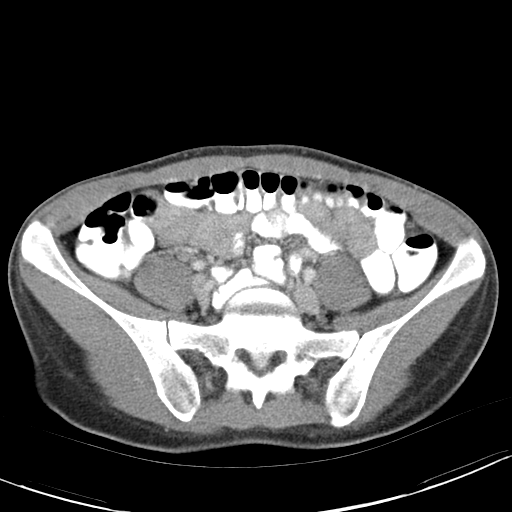
[im 41/80  soft-tissue]
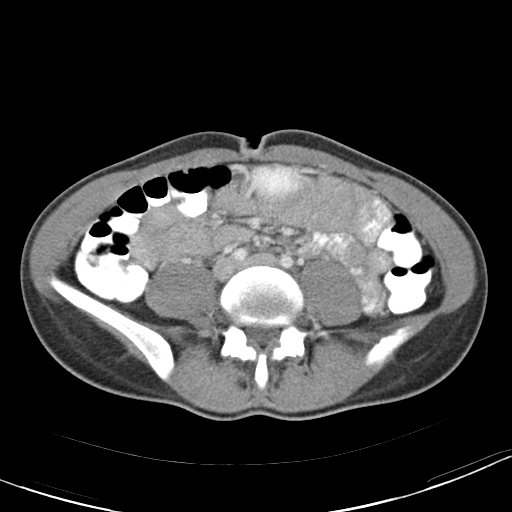
[im 46/80  soft-tissue]
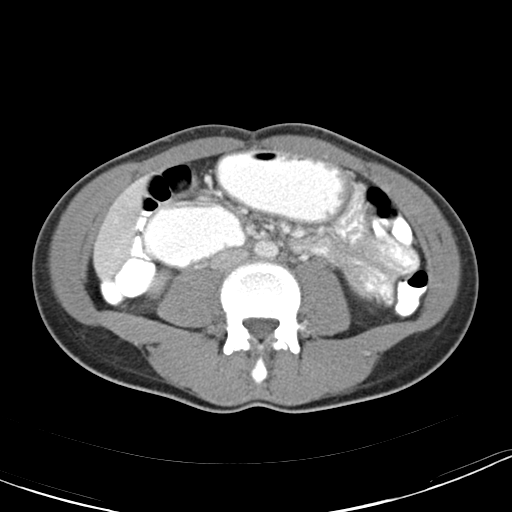
[im 51/80  soft-tissue]
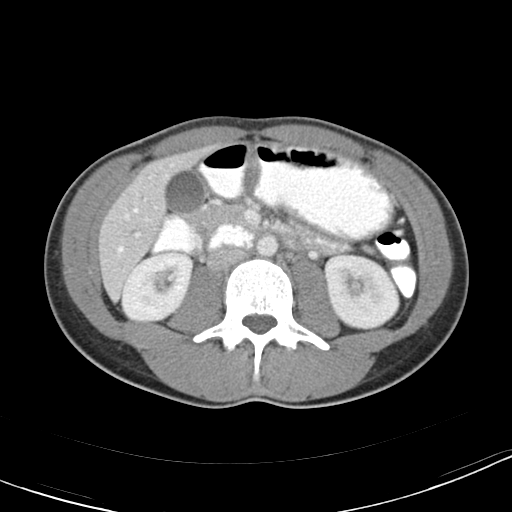
[im 51/80  bone]
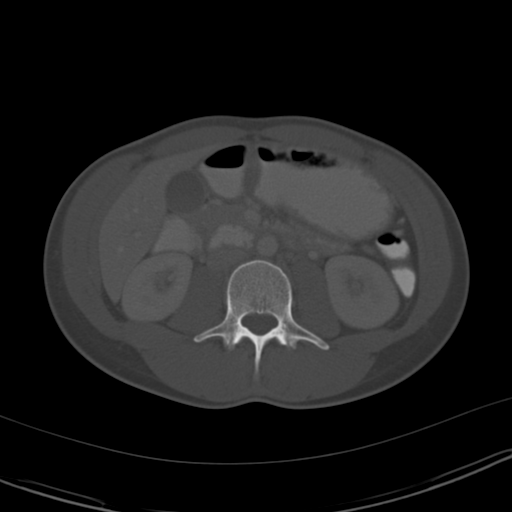
[im 57/80  soft-tissue]
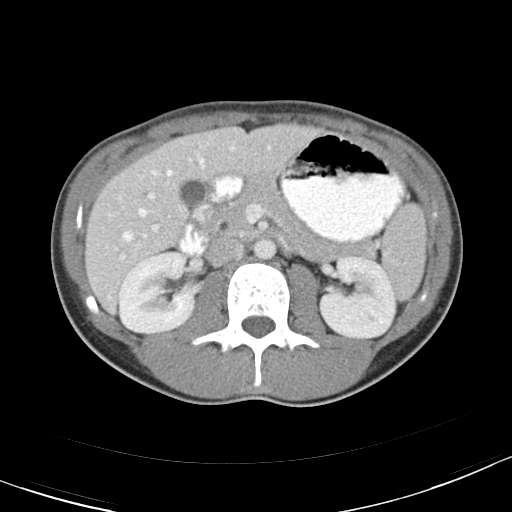
[im 64/80  soft-tissue]
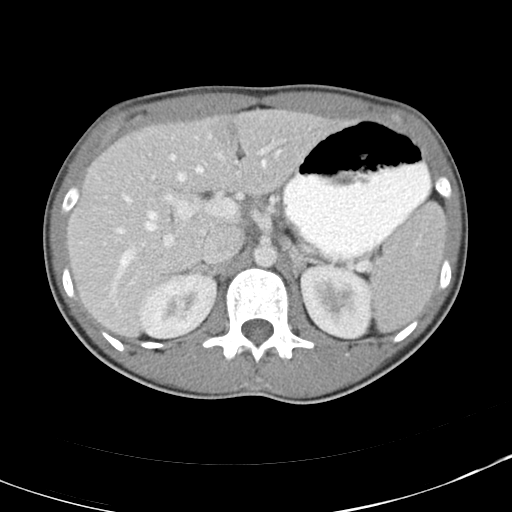
[im 69/80  soft-tissue]
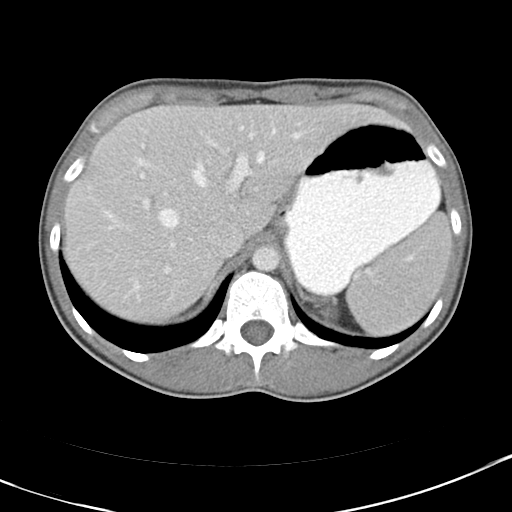
[im 74/80  soft-tissue]
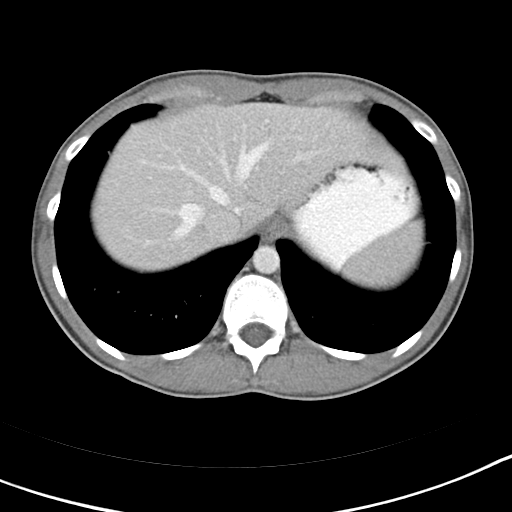

[Series 3: abd_pel_with 3.0 spo cor · coronal · 0.58mm/px · 3 of 68 slices shown]
[im 23/68  soft-tissue]
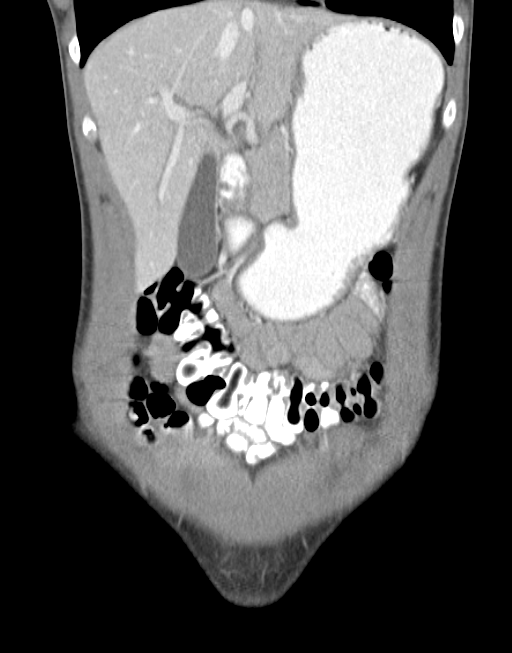
[im 30/68  soft-tissue]
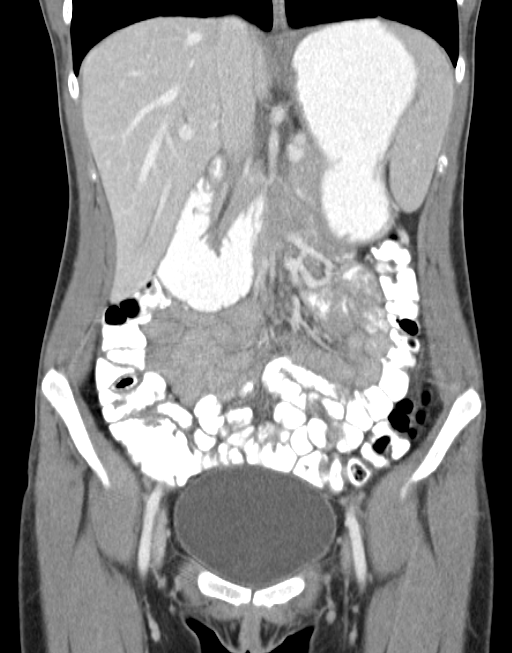
[im 38/68  soft-tissue]
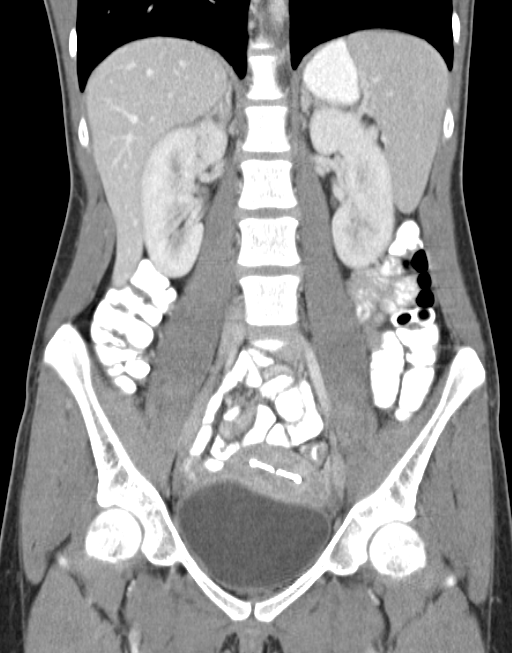

[16 of 46 positions shown; findings below may reference images not displayed]

FINDINGS: Patchy opacity noted in the anterior lung bases which may reflect
atelectasis, infiltrate or a combination. Remainder of the lung
bases is clear. Heart normal in size.

Normal liver, spleen, gallbladder, pancreas, adrenal glands,
kidneys, ureters, bladder.

IUD well-positioned in a normal uterus.  No adnexal masses.

No adenopathy.  No abnormal fluid collections.

Normal bowel. Appendix not definitively seen, but no evidence of
appendicitis.

No bony abnormality.
IMPRESSION: 1. Patchy opacity in the anterior lung bases. Given history of
cough, this may reflect pneumonia. Opacity could be due to
atelectasis or combination.
2. No abnormality below the diaphragm.

## 2015-10-15 ENCOUNTER — Encounter: Payer: Self-pay | Admitting: Advanced Practice Midwife

## 2015-10-15 ENCOUNTER — Other Ambulatory Visit (HOSPITAL_COMMUNITY)
Admission: RE | Admit: 2015-10-15 | Discharge: 2015-10-15 | Disposition: A | Discharge status: Home / Self Care | Payer: Medicaid Other | Source: Ambulatory Visit | Attending: Obstetrics & Gynecology | Admitting: Obstetrics & Gynecology

## 2015-10-15 ENCOUNTER — Ambulatory Visit (INDEPENDENT_AMBULATORY_CARE_PROVIDER_SITE_OTHER): Payer: Self-pay | Admitting: Advanced Practice Midwife

## 2015-10-15 VITALS — BP 110/64 | HR 68 | Ht 61.0 in | Wt 96.0 lb

## 2015-10-15 DIAGNOSIS — Z30432 Encounter for removal of intrauterine contraceptive device: Secondary | ICD-10-CM

## 2015-10-15 DIAGNOSIS — Z01419 Encounter for gynecological examination (general) (routine) without abnormal findings: Secondary | ICD-10-CM

## 2015-10-15 DIAGNOSIS — Z3202 Encounter for pregnancy test, result negative: Secondary | ICD-10-CM

## 2015-10-15 LAB — POCT URINE PREGNANCY: PREG TEST UR: NEGATIVE

## 2015-10-15 MED ORDER — NORGESTIMATE-ETH ESTRADIOL 0.25-35 MG-MCG PO TABS: 1.0000 | ORAL_TABLET | Freq: Every day | ORAL | 11 refills | Status: DC

## 2015-10-15 NOTE — Progress Notes (Signed)
Kari KocherBrittany A Khan 25 y.o.  Vitals:   10/15/15 1131  BP: 110/64  Pulse: 68     Filed Weights   10/15/15 1131  Weight: 96 lb (43.5 kg)    Past Medical History: Past Medical History:  Diagnosis Date  . DVT (deep venous thrombosis) (HCC)     Past Surgical History: History reviewed. No pertinent surgical history.  Family History: Family History  Problem Relation Age of Onset  . Hypertension Paternal Grandfather   . Hypertension Paternal Grandmother   . Diabetes Maternal Grandmother   . Hypertension Maternal Grandmother   . Hypertension Maternal Grandfather   . Hypertension Father   . Hypertension Mother     Social History: Social History  Substance Use Topics  . Smoking status: Current Every Day Smoker    Packs/day: 1.00    Types: Cigarettes  . Smokeless tobacco: Never Used  . Alcohol use Yes     Comment: occ    Allergies: No Known Allergies    Current Outpatient Prescriptions:  .  doxycycline (VIBRAMYCIN) 100 MG capsule, Take 1 capsule (100 mg total) by mouth 2 (two) times daily. (Patient not taking: Reported on 10/15/2015), Disp: 28 capsule, Rfl: 0 .  norgestimate-ethinyl estradiol (ORTHO-CYCLEN,SPRINTEC,PREVIFEM) 0.25-35 MG-MCG tablet, Take 1 tablet by mouth daily., Disp: 1 Package, Rfl: 11 .  oxyCODONE-acetaminophen (PERCOCET/ROXICET) 5-325 MG per tablet, Take 1 tablet by mouth every 4 (four) hours as needed for severe pain. (Patient not taking: Reported on 10/15/2015), Disp: 12 tablet, Rfl: 0  History of Present Illness: Here for pap and physical and IUD removal. Does not have health insurance.  Lasat pap 5 years ago.  Has been with boyfriend for 7 years. 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  Has had it for nearly 5 years. Used to be on YAZ, but she smokes so should not be on dispirinone.  Will tx Sprintec.   Review of Systems   Patient denies any headaches, blurred vision, shortness of breath, chest pain, abdominal pain, problems with bowel movements,  urination, or intercourse.   Physical Exam: General:  Well developed, well nourished, no acute distress Skin:  Warm and dry Neck:  Midline trachea, normal thyroid Lungs; Clear to auscultation bilaterally Breast:  No dominant palpable mass, retraction, or nipple discharge Cardiovascular: Regular rate and rhythm Abdomen:  Soft, non tender, no hepatosplenomegaly Pelvic:  External genitalia is normal in appearance.  The vagina is normal in appearance.  The cervix is bulbous.  Uterus is felt to be normal size, shape, and contour.  No adnexal masses or tenderness noted. IUD easily removed Extremities:  No swelling or varicosities noted Psych:  No mood changes.     Impression: Normal GYN exam IUD removal     Plan: Start Sprintec today.  BU for 2 weeks.

## 2015-10-16 LAB — CYTOLOGY - PAP

## 2016-08-13 ENCOUNTER — Other Ambulatory Visit: Payer: Self-pay | Admitting: Advanced Practice Midwife
